# Patient Record
Sex: Male | Born: 1985 | Race: White | Hispanic: No | Marital: Single | Smoking: Current every day smoker
Health system: Southern US, Community
[De-identification: ages and names within clinical notes are randomized; demographics above are authoritative.]

---

## 2017-01-14 ENCOUNTER — Ambulatory Visit (INDEPENDENT_AMBULATORY_CARE_PROVIDER_SITE_OTHER): Payer: Worker's Compensation

## 2017-01-14 ENCOUNTER — Ambulatory Visit
Admission: EM | Admit: 2017-01-14 | Discharge: 2017-01-14 | Disposition: A | Discharge status: Home / Self Care | Payer: Worker's Compensation | Attending: Family Medicine | Admitting: Family Medicine

## 2017-01-14 ENCOUNTER — Other Ambulatory Visit: Payer: Self-pay

## 2017-01-14 DIAGNOSIS — S46911A Strain of unspecified muscle, fascia and tendon at shoulder and upper arm level, right arm, initial encounter: Secondary | ICD-10-CM | POA: Diagnosis not present

## 2017-01-14 DIAGNOSIS — X503XXA Overexertion from repetitive movements, initial encounter: Secondary | ICD-10-CM

## 2017-01-14 DIAGNOSIS — M25511 Pain in right shoulder: Secondary | ICD-10-CM | POA: Diagnosis not present

## 2017-01-14 MED ORDER — HYDROCODONE-ACETAMINOPHEN 5-325 MG PO TABS: ORAL_TABLET | ORAL | 0 refills | Status: AC

## 2017-01-14 MED ORDER — CYCLOBENZAPRINE HCL 10 MG PO TABS: 10.0000 mg | ORAL_TABLET | Freq: Every day | ORAL | 0 refills | Status: AC

## 2017-01-14 MED ORDER — TRAMADOL HCL 50 MG PO TABS: 50.0000 mg | ORAL_TABLET | Freq: Four times a day (QID) | ORAL | 0 refills | Status: DC | PRN

## 2017-01-14 NOTE — ED Provider Notes (Signed)
[facility]    CSN: [id number] Arrival date & time: 01/14/17  1208     History   Chief Complaint Chief Complaint  Patient presents with  . Shoulder Pain    HPI Bryan James is a 31 y.o. male.   31 yo male with a c/o right shoulder pain for the past 2 days after injuring it at work. 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 States he has pain now with range of motion. Denies any fall or other specific traumatic impact injury.    The history is provided by the patient.    History reviewed. No pertinent past medical history.  There are no active problems to display for this patient.   History reviewed. No pertinent surgical history.     Home Medications    Prior to Admission medications   Medication Sig Start Date End Date Taking? Authorizing Provider  cyclobenzaprine (FLEXERIL) 10 MG tablet Take 1 tablet (10 mg total) by mouth at bedtime. 01/14/17   Payton Mccallumonty, Tyyonna Soucy, MD  HYDROcodone-acetaminophen (NORCO/VICODIN) 5-325 MG tablet 1-2 tablets po qd prn 01/14/17   Payton Mccallumonty, Kaysia Willard, MD    Family History History reviewed. No pertinent family history.  Social History Social History   Tobacco Use  . Smoking status: Current Every Day Smoker  . Smokeless tobacco: Never Used  Substance Use Topics  . Alcohol use: No    Frequency: Never  . Drug use: No     Allergies   Patient has no known allergies.   Review of Systems Review of Systems   Physical Exam Triage Vital Signs ED Triage Vitals  Enc Vitals Group     BP 01/14/17 1243 128/77     Pulse Rate 01/14/17 1243 73     Resp --      Temp 01/14/17 1243 98.2 F (36.8 C)     Temp Source 01/14/17 1243 Oral     SpO2 01/14/17 1243 100 %     Weight 01/14/17 1246 220 lb (99.8 kg)     Height 01/14/17 1246 5\' 11"  (1.803 m)     Head Circumference --      Peak Flow --      Pain Score 01/14/17 1246 7       Pain Loc --      Pain Edu? --      Excl. in GC? --    No data found.  Updated Vital Signs BP 128/77 (BP Location: Left Arm)   Pulse 73   Temp 98.2 F (36.8 C) (Oral)   Ht 5\' 11"  (1.803 m)   Wt 220 lb (99.8 kg)   SpO2 100%   BMI 30.68 kg/m   Visual Acuity Right Eye Distance:   Left Eye Distance:   Bilateral Distance:    Right Eye Near:   Left Eye Near:    Bilateral Near:     Physical Exam  Constitutional: He appears well-developed and well-nourished. No distress.  Musculoskeletal:       Right shoulder: He exhibits decreased range of motion, tenderness, bony tenderness, pain and spasm. He exhibits no swelling, no effusion, no crepitus, no deformity, no laceration, normal pulse and normal strength.  Skin: He is not diaphoretic.  Nursing note and vitals reviewed.    UC Treatments / Results  Labs (all labs ordered are listed, but only abnormal results are displayed) Labs Reviewed - No data to  display  EKG  EKG Interpretation None       Radiology Dg Shoulder Right  Result Date: 01/14/2017 CLINICAL DATA:  Injured right shoulder at work. Persistent pain and limited range of motion. EXAM: RIGHT SHOULDER - 2+ VIEW COMPARISON:  None. FINDINGS: The joint spaces are maintained. No acute bony findings or bone lesion. No abnormal soft tissue calcifications. The visualized lung is clear and the visualized ribs are intact. IMPRESSION: No fracture or dislocation. Electronically Signed   By: Rudie MeyerP.  Gallerani M.D.   On: 01/14/2017 14:31    Procedures Procedures (including critical care time)  Medications Ordered in UC Medications - No data to display   Initial Impression / Assessment and Plan / UC Course  I have reviewed the triage vital signs and the nursing notes.  Pertinent labs & imaging results that were available during my care of the patient were reviewed by me and considered in my medical decision making (see chart for details).       Final Clinical  Impressions(s) / UC Diagnoses   Final diagnoses:  Acute pain of right shoulder  Strain of right shoulder, initial encounter    ED Discharge Orders        Ordered    cyclobenzaprine (FLEXERIL) 10 MG tablet  Daily at bedtime     01/14/17 1437    HYDROcodone-acetaminophen (NORCO/VICODIN) 5-325 MG tablet     01/14/17 1437     1. x-ray results and diagnosis reviewed with patient 2. rx as per orders above; reviewed possible side effects, interactions, risks and benefits  3. Recommend supportive treatment with rest, work restrictions as per form, medications 4. Follow-up with Occupational Health in next 1-2 days   Controlled Substance Prescriptions [place] Controlled Substance Registry consulted? No   Payton Mccallumonty, Blayke Pinera, MD 01/14/17 1438

## 2017-01-14 NOTE — Discharge Instructions (Signed)
Rest, ice/heat, gentle range of motion Work restriction Follow up with Occupational Health (workman's comp) for further management

## 2017-01-14 NOTE — ED Triage Notes (Signed)
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 Patient states it hurts to bare weight with shoulder.

## 2017-10-17 ENCOUNTER — Other Ambulatory Visit: Payer: Self-pay | Admitting: Orthopedic Surgery

## 2017-10-23 ENCOUNTER — Inpatient Hospital Stay: Admission: RE | Admit: 2017-10-23 | Payer: Self-pay | Source: Ambulatory Visit

## 2017-10-30 ENCOUNTER — Ambulatory Visit
Admission: RE | Admit: 2017-10-30 | Payer: Worker's Compensation | Source: Ambulatory Visit | Admitting: Orthopedic Surgery

## 2017-10-30 ENCOUNTER — Encounter: Admission: RE | Payer: Self-pay | Source: Ambulatory Visit

## 2017-10-30 SURGERY — ARTHROSCOPY, SHOULDER, WITH GLENOID LABRUM REPAIR
Anesthesia: Choice | Laterality: Right

## 2018-06-08 IMAGING — CR DG SHOULDER 2+V*R*
3 series · 3 of 3 positions shown · non-contrast
Comparison: None.

CLINICAL DATA: Injured right shoulder at work. Persistent pain and
limited range of motion.

EXAM:
RIGHT SHOULDER - 2+ VIEW

[shoulder grashey]
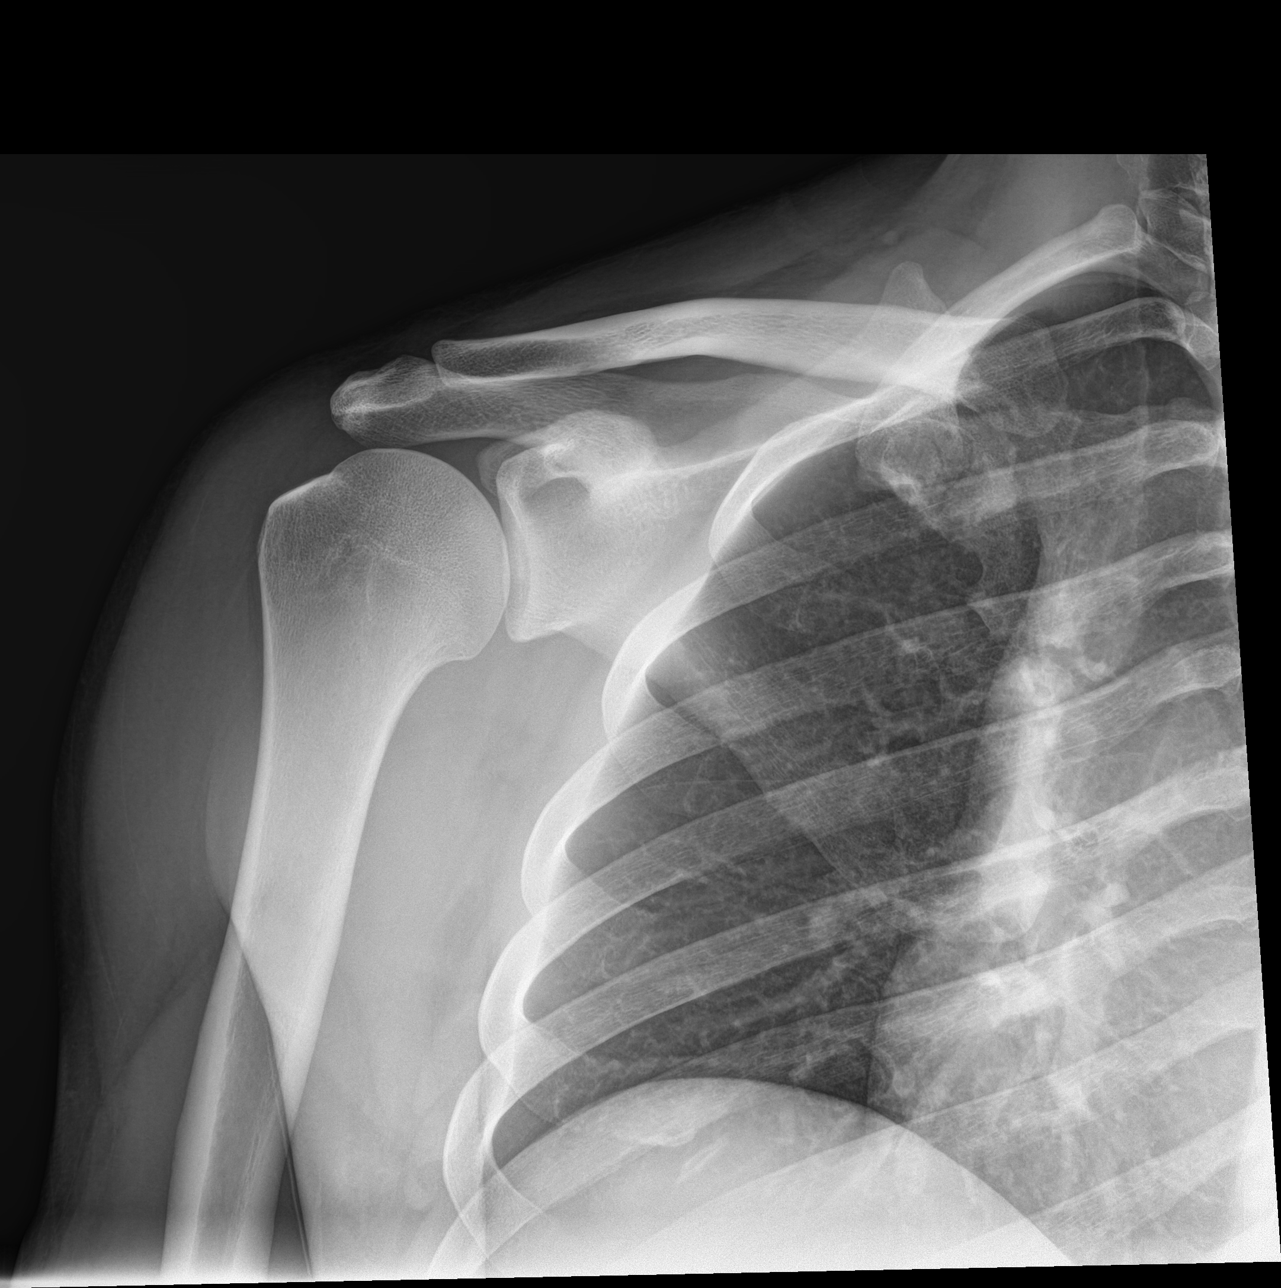

[shoulder y view]
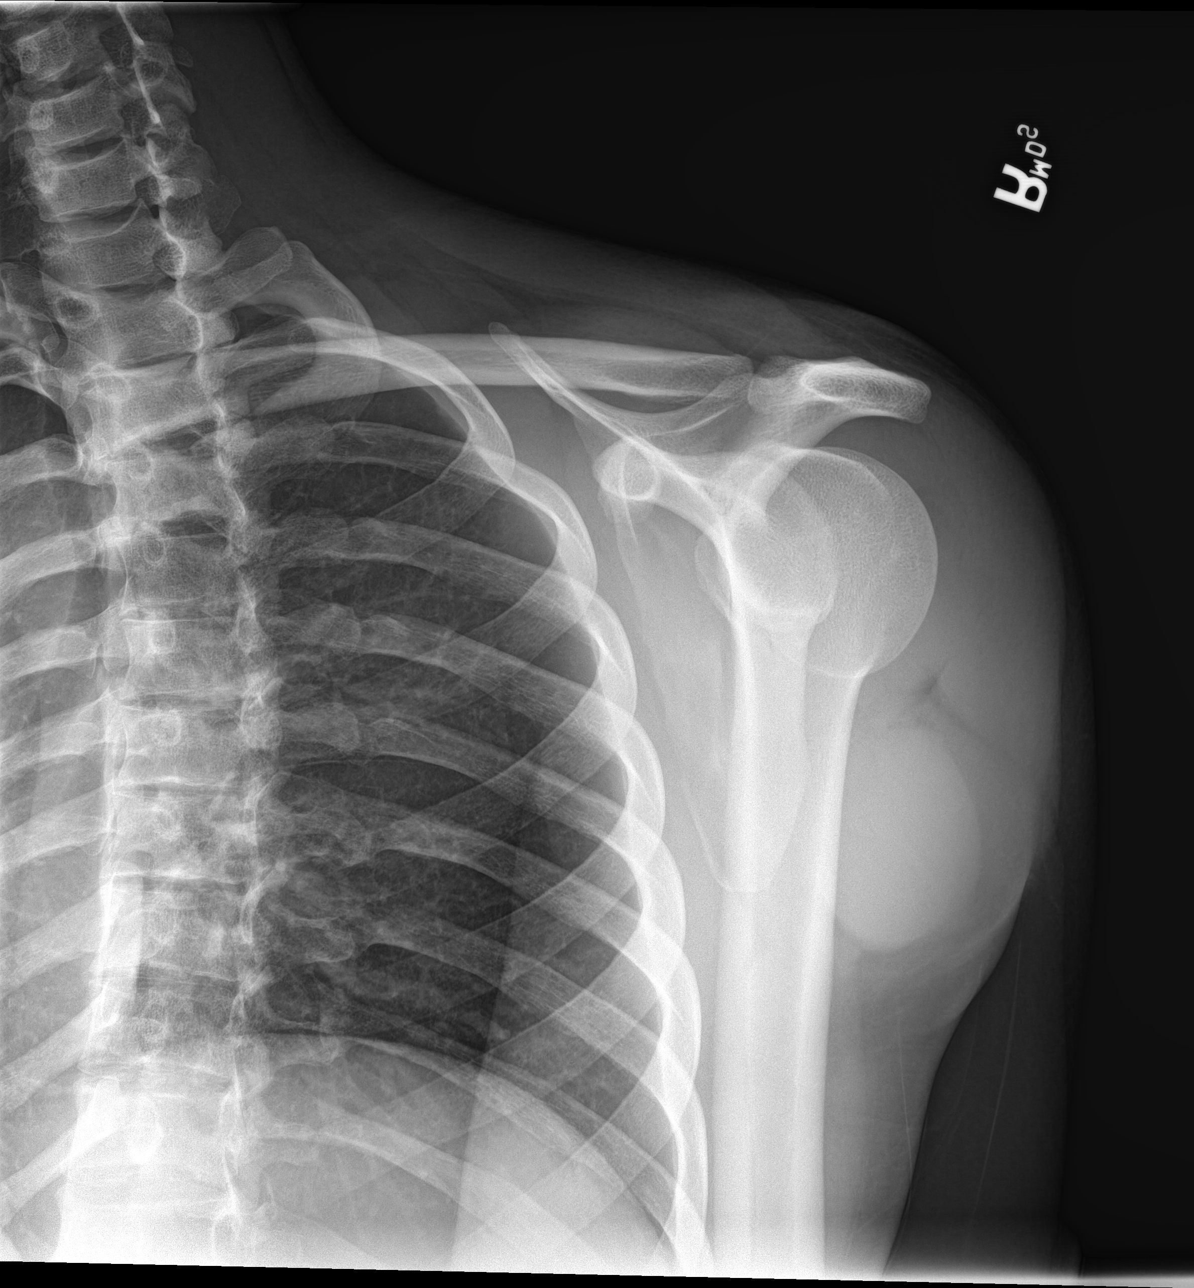

[shoulder axial]
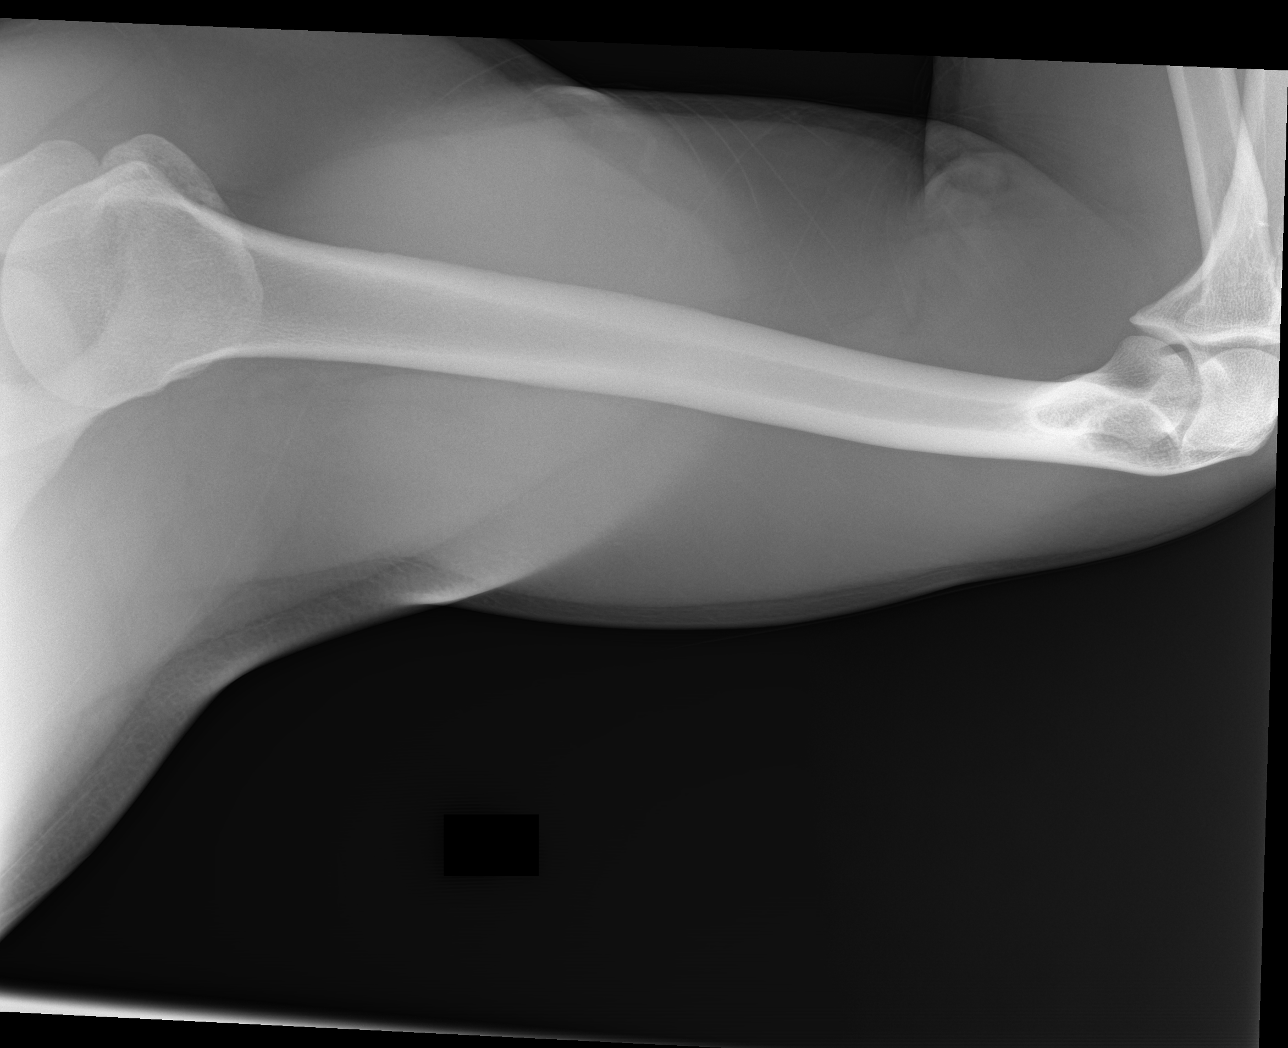

[3 of 3 positions shown; findings below may reference images not displayed]

FINDINGS: The joint spaces are maintained. No acute bony findings or bone
lesion. No abnormal soft tissue calcifications. The visualized lung
is clear and the visualized ribs are intact.
IMPRESSION: No fracture or dislocation.
